# Patient Record
Sex: Female | Born: 1935 | Race: White | Hispanic: No | Marital: Married
Health system: Southern US, Community
[De-identification: ages and names within clinical notes are randomized; demographics above are authoritative.]

---

## 2004-10-31 ENCOUNTER — Ambulatory Visit: Payer: Self-pay | Admitting: General Surgery

## 2005-01-01 ENCOUNTER — Other Ambulatory Visit: Payer: Self-pay

## 2005-01-02 ENCOUNTER — Observation Stay: Payer: Self-pay | Admitting: Internal Medicine

## 2005-11-22 ENCOUNTER — Ambulatory Visit: Payer: Self-pay | Admitting: Internal Medicine

## 2006-12-13 ENCOUNTER — Ambulatory Visit: Payer: Self-pay | Admitting: Internal Medicine

## 2007-03-06 ENCOUNTER — Other Ambulatory Visit: Payer: Self-pay

## 2007-03-06 ENCOUNTER — Inpatient Hospital Stay: Payer: Self-pay | Admitting: Internal Medicine

## 2008-02-25 ENCOUNTER — Ambulatory Visit: Payer: Self-pay | Admitting: Internal Medicine

## 2009-03-30 ENCOUNTER — Inpatient Hospital Stay: Payer: Self-pay | Admitting: Internal Medicine

## 2009-04-06 ENCOUNTER — Inpatient Hospital Stay: Payer: Self-pay | Admitting: Internal Medicine

## 2009-04-09 ENCOUNTER — Encounter: Payer: Self-pay | Admitting: Internal Medicine

## 2009-04-13 ENCOUNTER — Encounter: Payer: Self-pay | Admitting: Internal Medicine

## 2009-05-13 ENCOUNTER — Encounter: Payer: Self-pay | Admitting: Internal Medicine

## 2009-06-22 ENCOUNTER — Inpatient Hospital Stay: Payer: Self-pay | Admitting: Internal Medicine

## 2010-06-13 IMAGING — CR DG CHEST 1V
1 series · 1 of 1 positions shown · non-contrast
Comparison: none

REASON FOR EXAM: evaluate vp shunt (pt w/ ams)
COMMENTS:

[view not recorded]
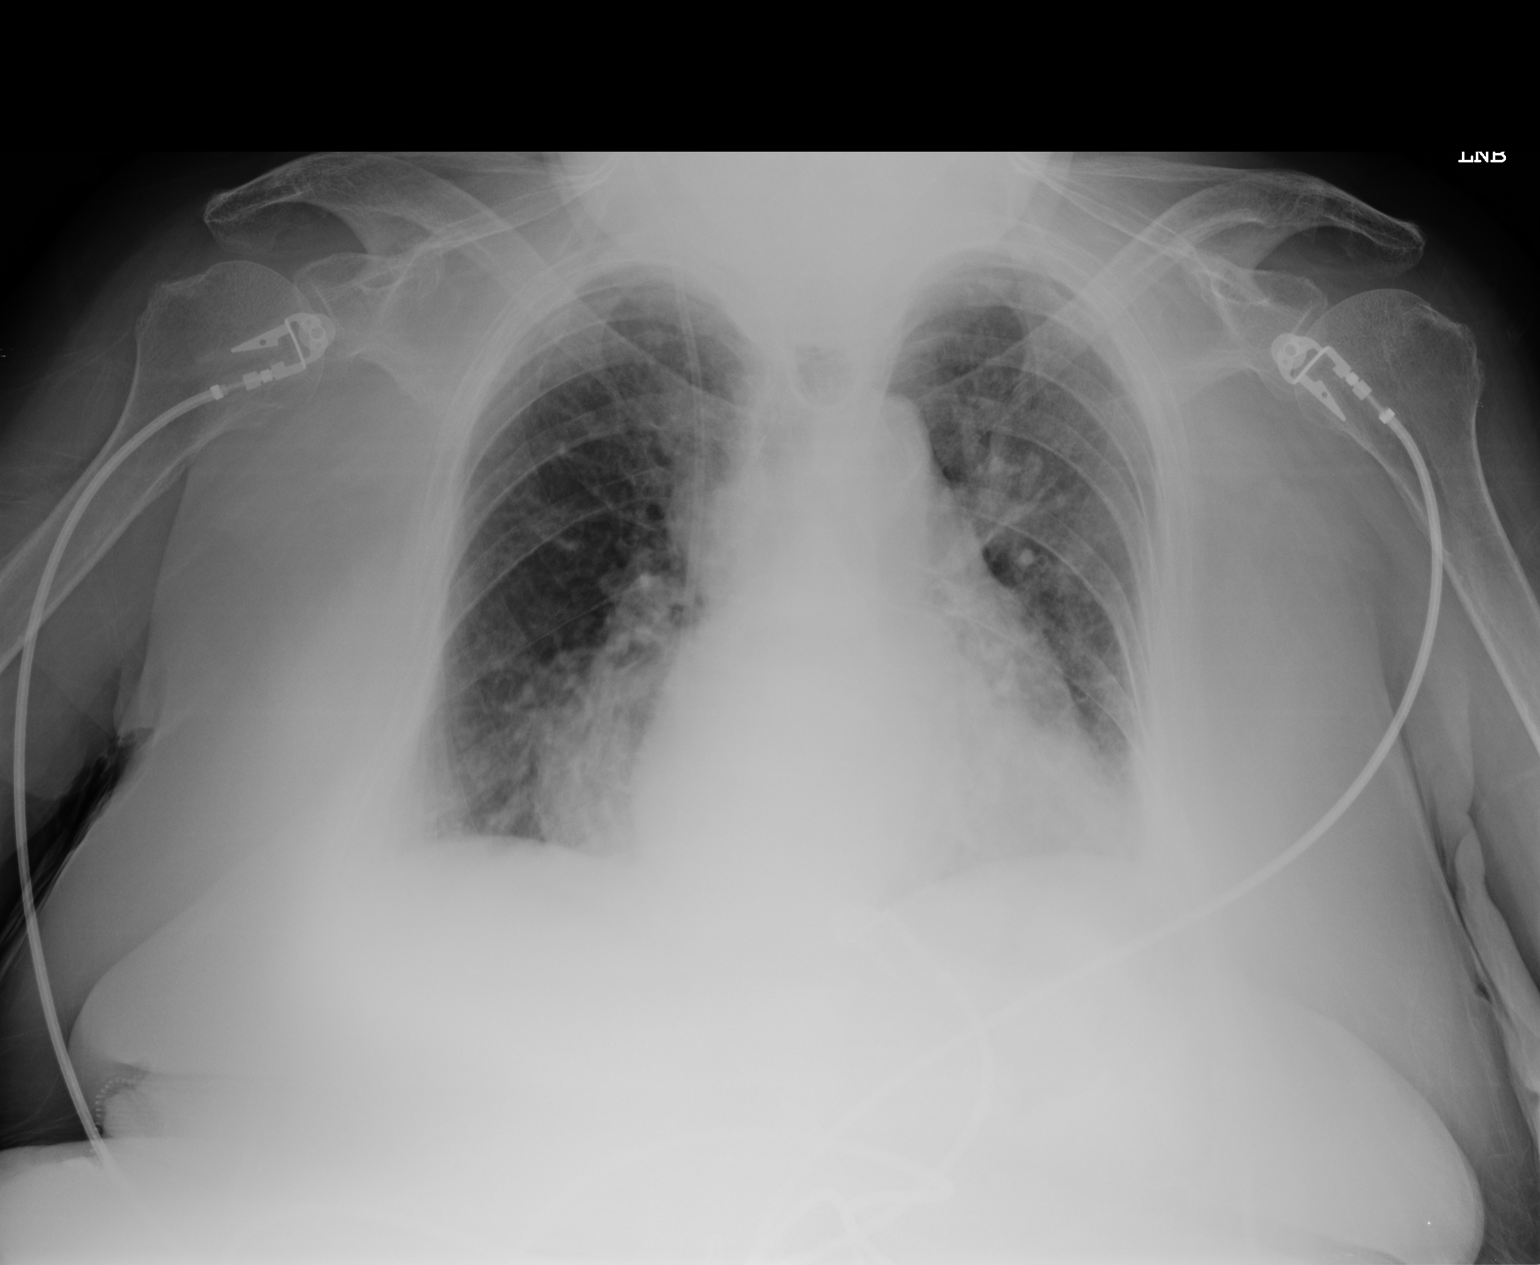

[1 of 1 positions shown; findings below may reference images not displayed]

PROCEDURE:     DXR - DXR CHEST 1 VIEWAP OR PA  - March 30, 2009  [DATE]

RESULT:      A single view of the chest shows cardiomegaly. There is
pulmonary vascular congestion. There appears to be underlying fibrosis.
Cardiac monitoring electrodes are present. A right-sided linear density
projects over the chest. It is difficult to determine if this is a shunt
tube or artifact.
IMPRESSION: 1.     Cardiomegaly.
2.     Pulmonary vascular congestion with probable underlying fibrosis.
Please note, on the previous study of 03/06/2007 the density over the right
chest was a shunt tube. This is poorly seen. Followup with PA and lateral
views would be helpful if evaluating the shunt tube is the primary concern.

## 2010-06-13 IMAGING — CT CT HEAD WITHOUT CONTRAST
2 series · 15 of 30 positions shown, 19 images · non-contrast
Comparison: none

REASON FOR EXAM: difficult to awaken, brain shunt
COMMENTS:

PROCEDURE:     CT  - CT HEAD WITHOUT CONTRAST  - March 30, 2009  [DATE]
RESULT:     Comparison: 03/06/2007
TECHNIQUE: Multiple axial images from the foramen magnum to the vertex were
obtained without IV contrast.

[Series 2: without · axial · non-contrast · 0.39mm/px · z∈[+679,+804]mm · 13 of 31 slices shown, 17 images]
[im 3/31  brain]
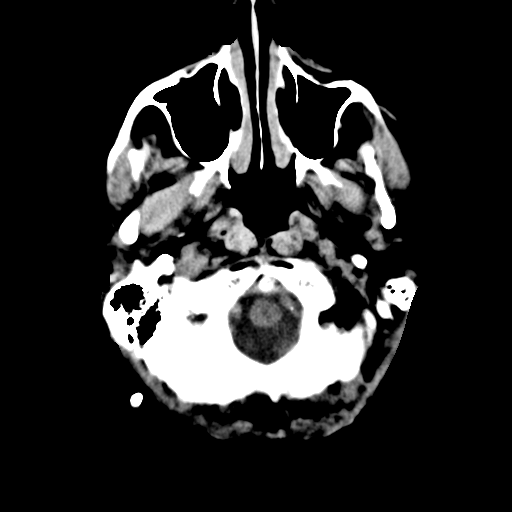
[im 3/31  bone]
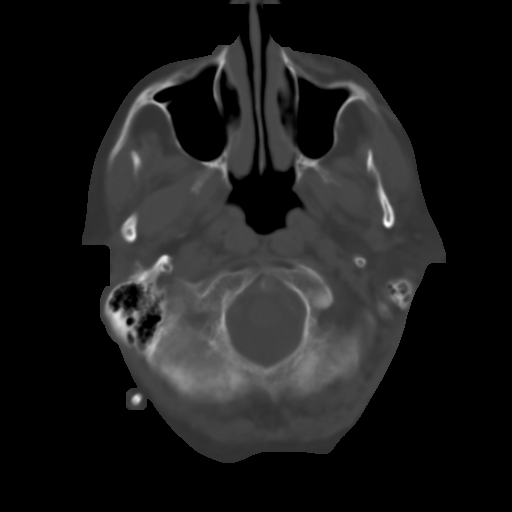
[im 5/31  brain]
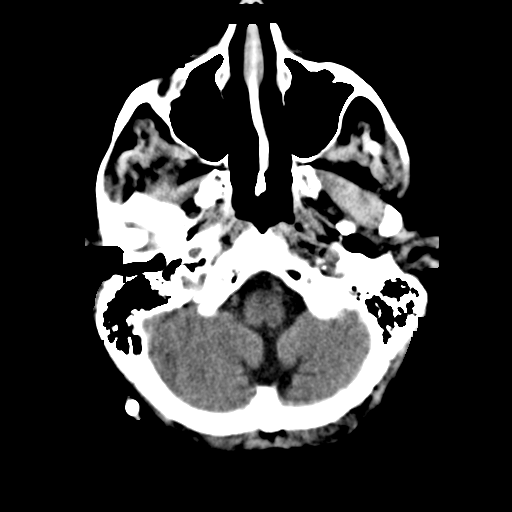
[im 7/31  brain]
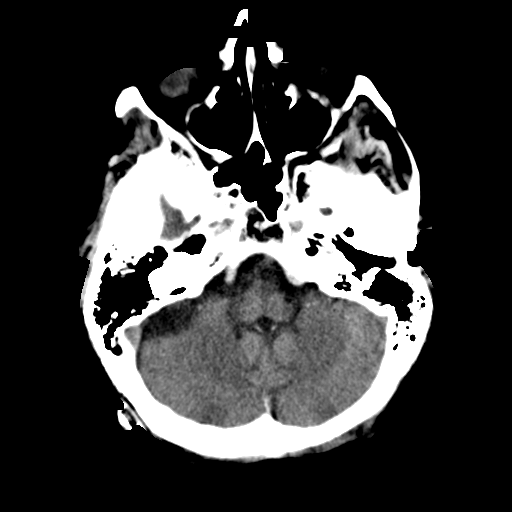
[im 9/31  brain]
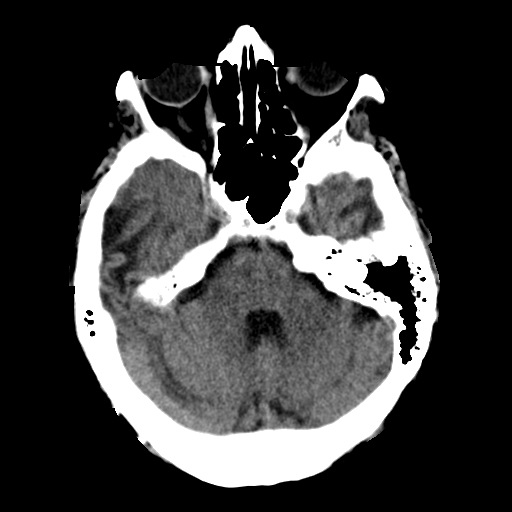
[im 11/31  brain]
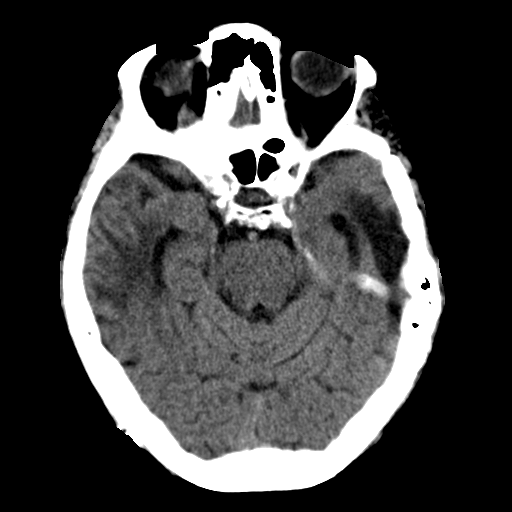
[im 11/31  bone]
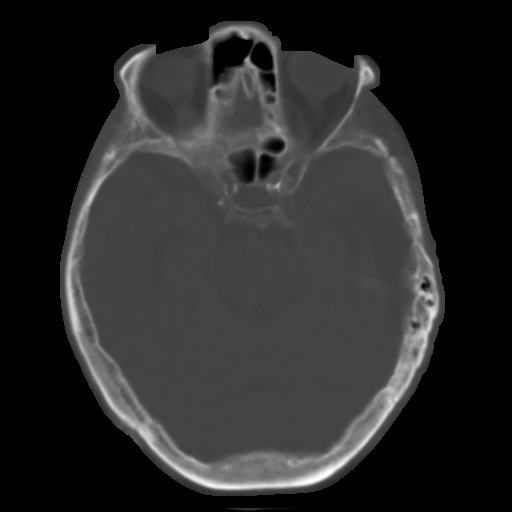
[im 13/31  brain]
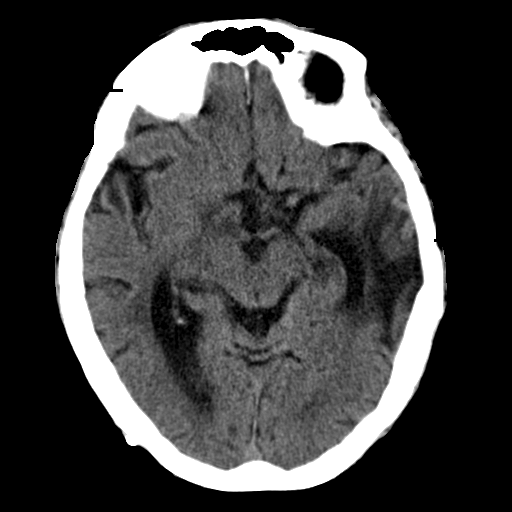
[im 16/31  brain]
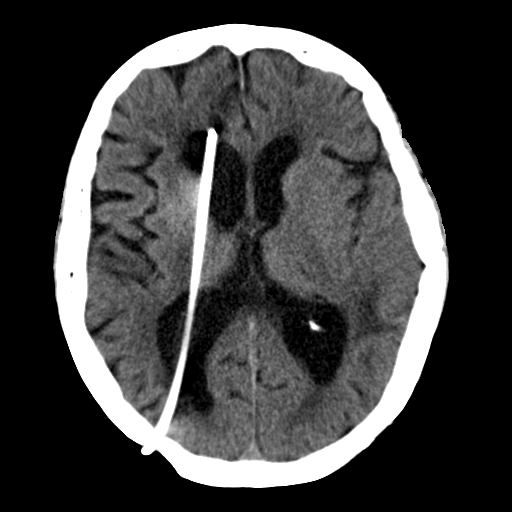
[im 18/31  brain]
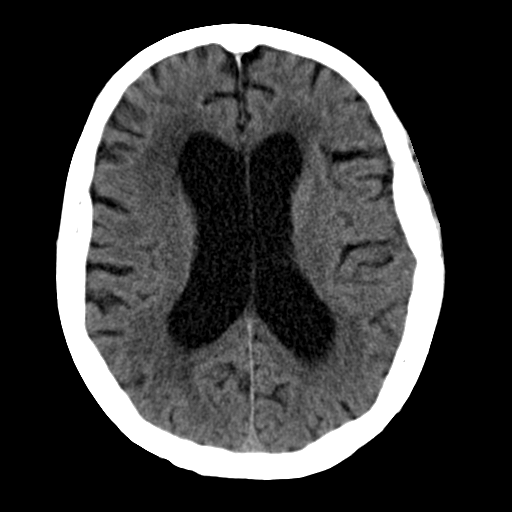
[im 20/31  brain]
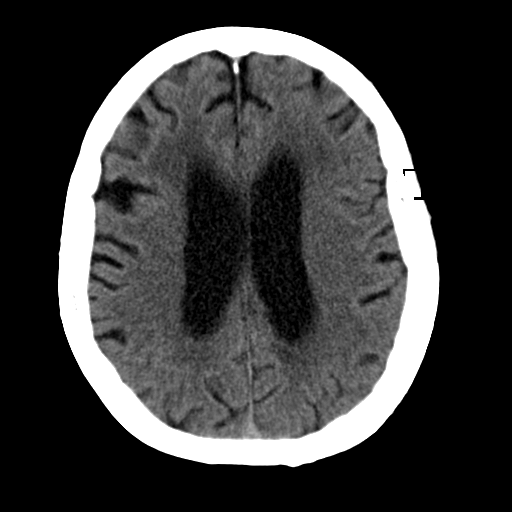
[im 20/31  bone]
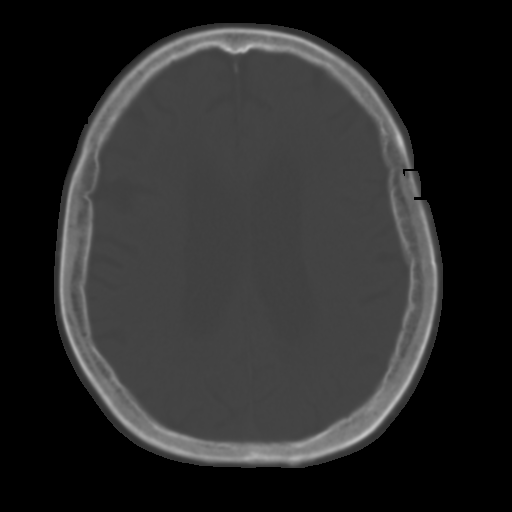
[im 22/31  brain]
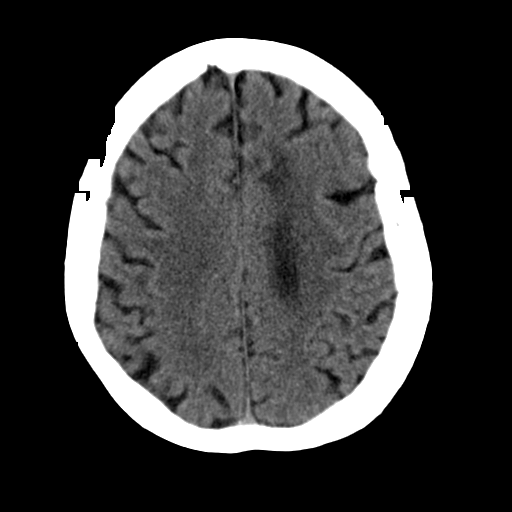
[im 24/31  brain]
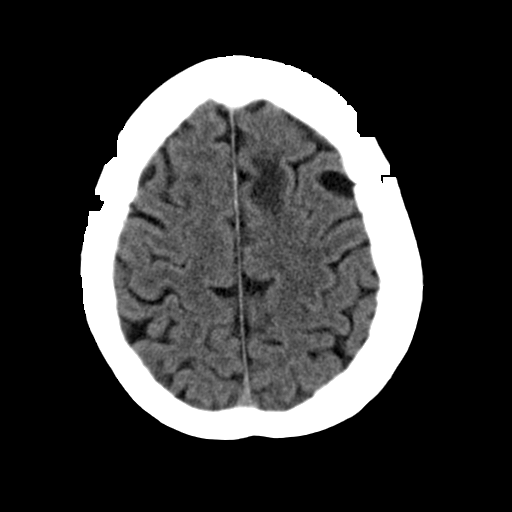
[im 26/31  brain]
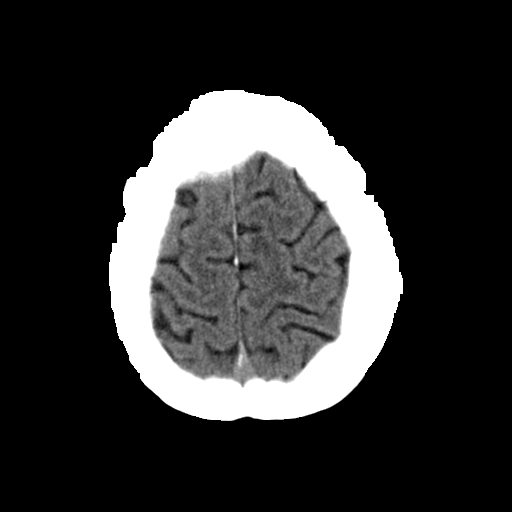
[im 28/31  brain]
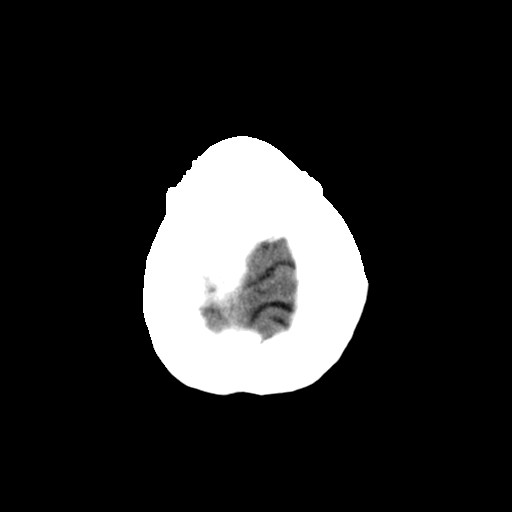
[im 28/31  bone]
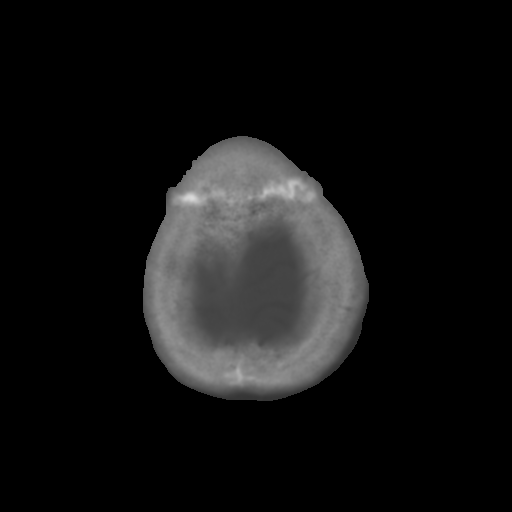

[Series 3: bone · axial · 0.39mm/px · z∈[+679,+699]mm · 2 of 31 slices shown]
[im 3/31  bone]
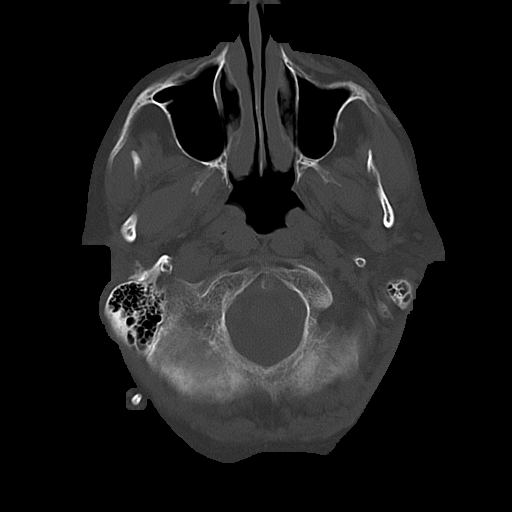
[im 7/31  bone]
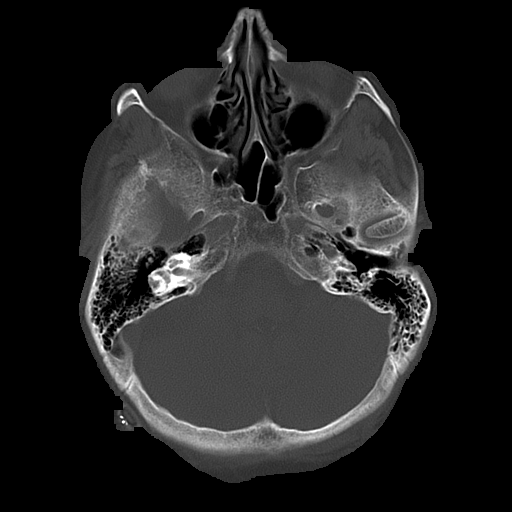

[15 of 30 positions shown; findings below may reference images not displayed]

FINDINGS: There is no evidence of mass effect, midline shift, or extra-axial fluid
collections.  There is no evidence of a space-occupying lesion or
intracranial hemorrhage. There is no evidence of a cortical-based area of
acute infarction. There is a right parietal burr hole with a right-sided
ventriculostomy catheter entering the occipital horn of the right lateral
ventricle with the tip terminating along the anterior surface of the right
frontal horn of the lateral ventricle. The overall appearance is unchanged
the prior examination. The ventricles are stable in appearance. The basal
cisterns are patent. There is bilateral inferior temporal lobe
encephalomalacia.

There is generalized cerebral atrophy. There is periventricular white matter
low attenuation likely secondary to microangiopathy.

Visualized portions of the orbits are unremarkable. The paranasal sinuses
and mastoid air cells are unremarkable.

The osseous structures are unremarkable.
IMPRESSION: No acute intracranial process.

## 2010-06-14 IMAGING — US US CAROTID DUPLEX BILAT
1 series · 17 of 24 positions shown · non-contrast
Comparison: none

REASON FOR EXAM: CVA
COMMENTS:

[Series 1: us carotid duplex bilat · 17 of 64 slices shown]
[im 1/64]
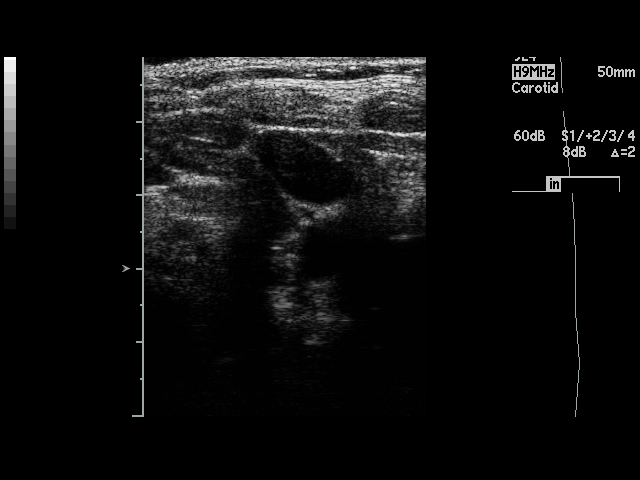
[im 6/64]
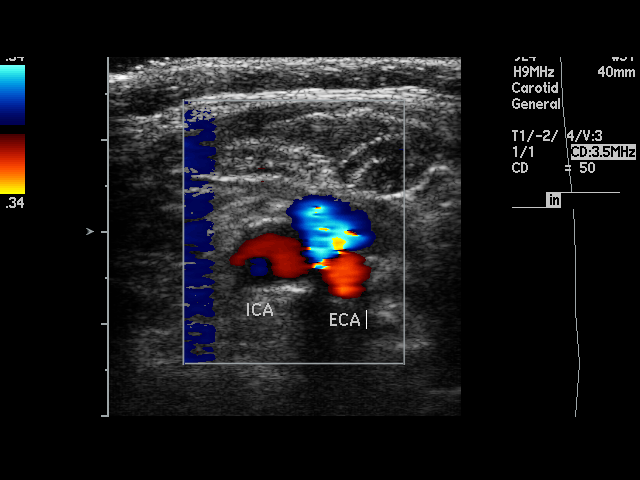
[im 9/64]
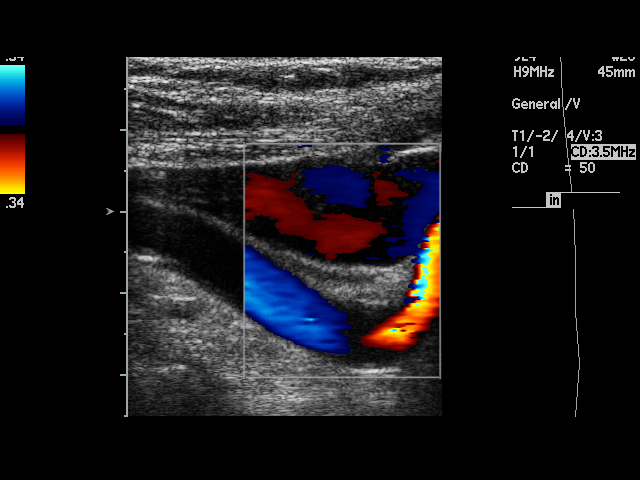
[im 11/64]
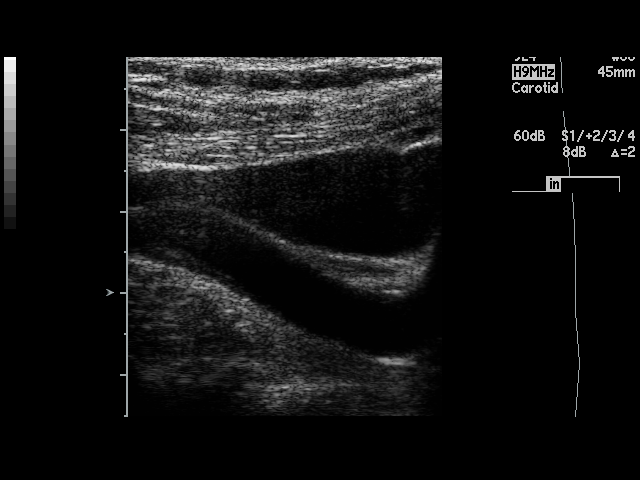
[im 17/64]
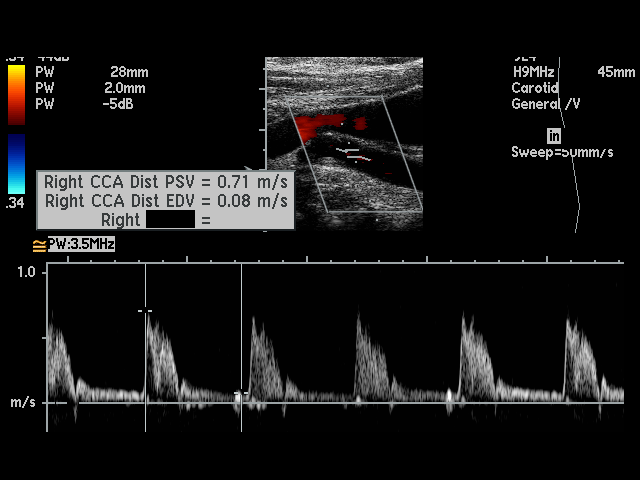
[im 20/64]
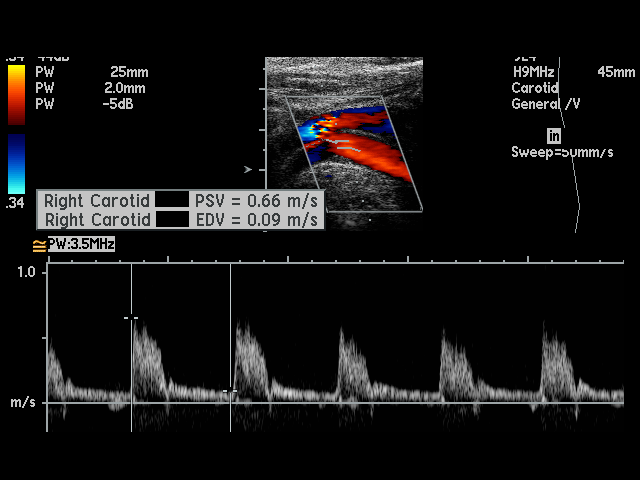
[im 25/64]
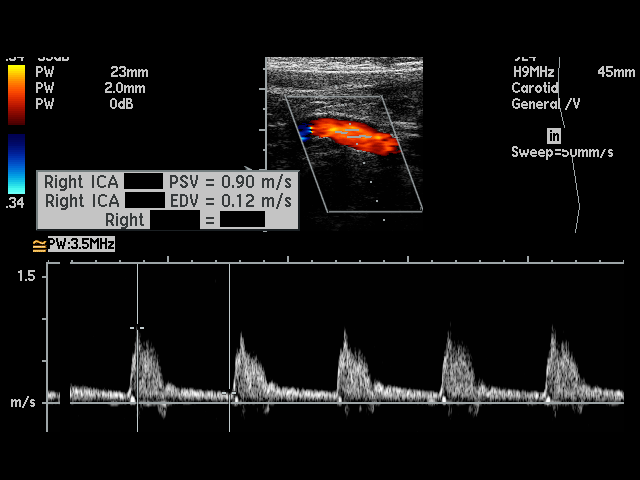
[im 28/64]
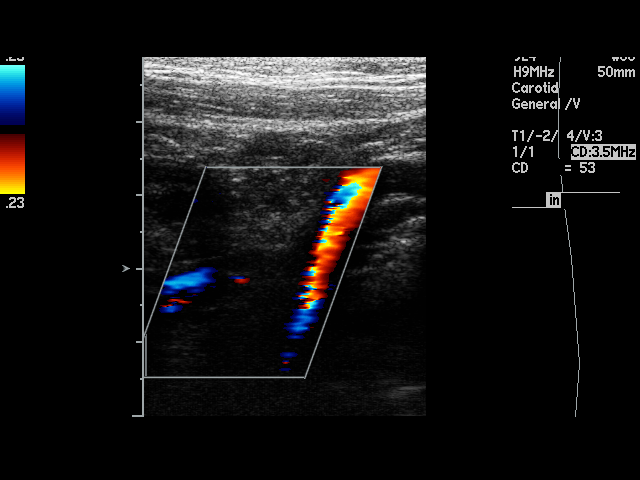
[im 33/64]
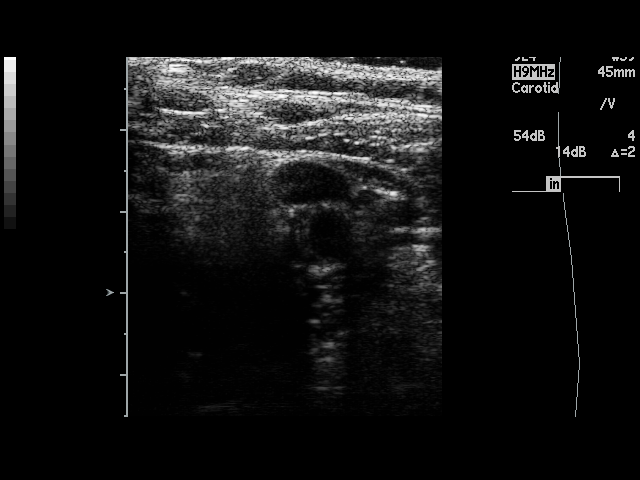
[im 36/64]
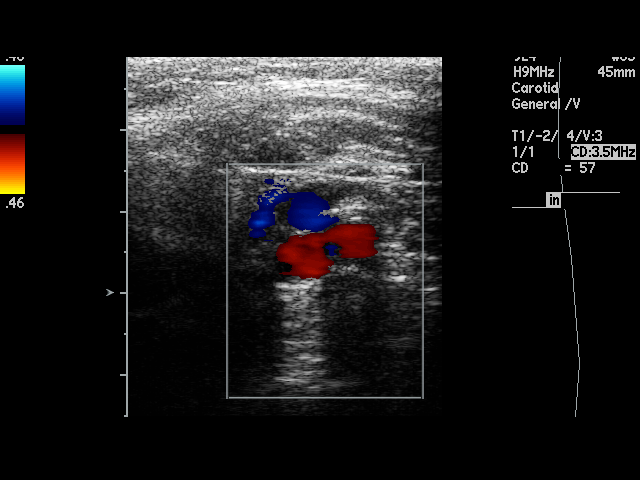
[im 39/64]
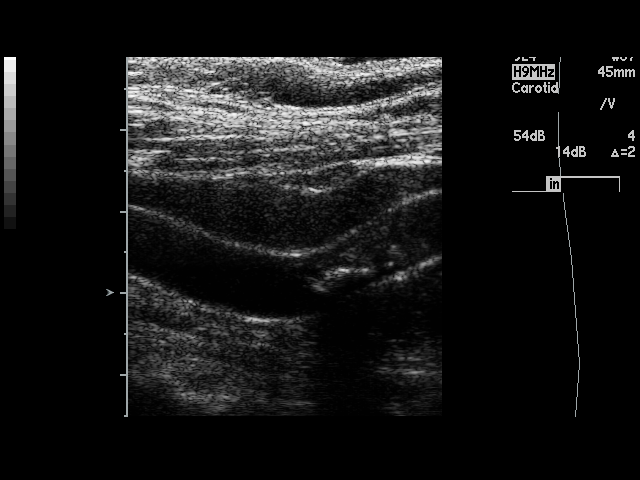
[im 44/64]
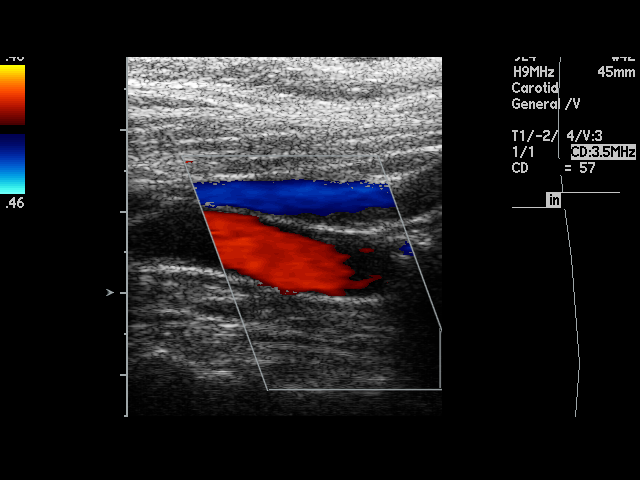
[im 47/64]
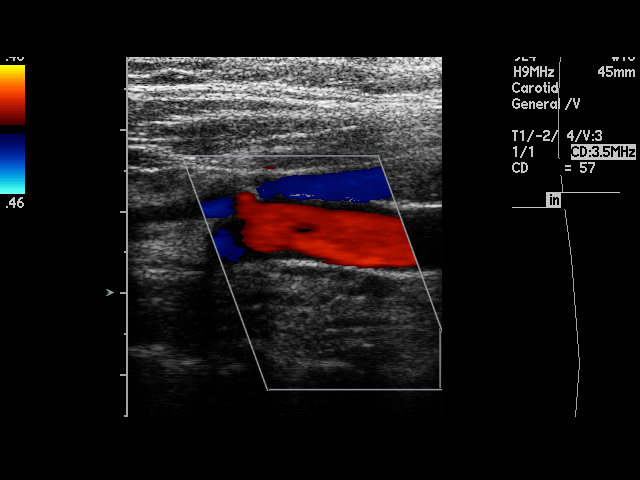
[im 53/64]
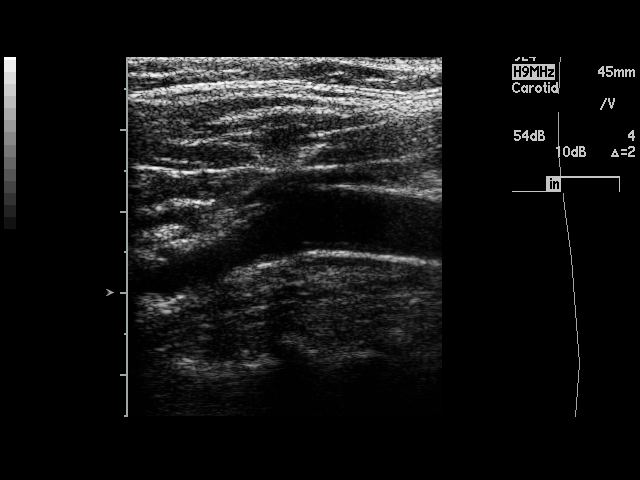
[im 55/64]
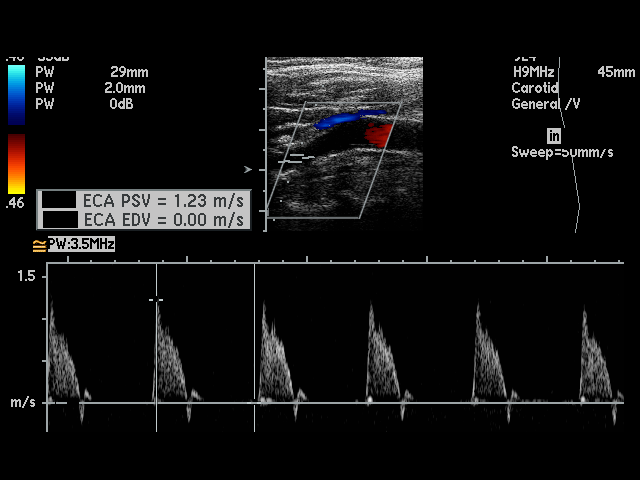
[im 58/64]
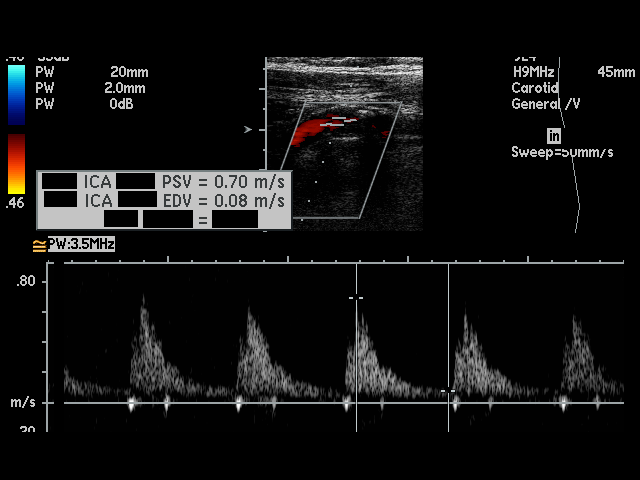
[im 64/64]
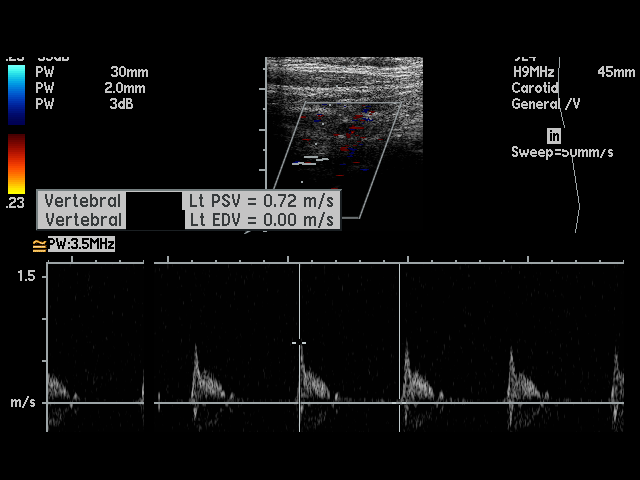

[17 of 24 positions shown; findings below may reference images not displayed]

PROCEDURE:     US  - US CAROTID DOPPLER BILATERAL  - March 31, 2009  [DATE]

RESULT:     There is a small amount of mixed smooth and calcific plaque
formation about the carotid bifurcations bilaterally. On the right, the peak
right common carotid artery flow velocity measures 0.93 meters/second and
the peak right internal carotid artery flow velocity measures
meters/second. ICA/CCA ratio is 0.97.

On the left, the peak left common carotid artery flow velocity measures
meters/second and the peak left internal carotid artery flow velocity
measures 0.7 meters/second. ICA/CCA ratio is 0.55. These values bilaterally
are consistent with the absence of hemodynamically significant stenosis.

There is observed antegrade flow in both vertebrals.
IMPRESSION: 1. No hemodynamically significant stenosis is identified on either side.
2. There is antegrade flow in both vertebrals.

## 2010-10-26 ENCOUNTER — Emergency Department: Payer: Self-pay | Admitting: Emergency Medicine

## 2012-11-13 ENCOUNTER — Ambulatory Visit: Payer: Self-pay | Admitting: Internal Medicine

## 2012-11-24 ENCOUNTER — Inpatient Hospital Stay: Payer: Self-pay | Admitting: Internal Medicine

## 2012-11-24 LAB — CBC
HCT: 38.9 % (ref 35.0–47.0)
HGB: 12.4 g/dL (ref 12.0–16.0)
MCHC: 32 g/dL (ref 32.0–36.0)
MCV: 97 fL (ref 80–100)
RBC: 3.99 10*6/uL (ref 3.80–5.20)
WBC: 8.7 10*3/uL (ref 3.6–11.0)

## 2012-11-24 LAB — COMPREHENSIVE METABOLIC PANEL
Albumin: 2.9 g/dL — ABNORMAL LOW (ref 3.4–5.0)
Anion Gap: 6 — ABNORMAL LOW (ref 7–16)
Co2: 28 mmol/L (ref 21–32)
Creatinine: 2.05 mg/dL — ABNORMAL HIGH (ref 0.60–1.30)
EGFR (African American): 25 — ABNORMAL LOW
EGFR (Non-African Amer.): 21 — ABNORMAL LOW
Osmolality: 284 (ref 275–301)
Potassium: 3.4 mmol/L — ABNORMAL LOW (ref 3.5–5.1)
SGPT (ALT): 16 U/L (ref 12–78)

## 2012-11-24 LAB — PROTIME-INR
INR: 2.4
Prothrombin Time: 26.2 secs — ABNORMAL HIGH (ref 11.5–14.7)

## 2012-11-24 LAB — TROPONIN I: Troponin-I: 0.04 ng/mL

## 2012-11-24 LAB — CK TOTAL AND CKMB (NOT AT ARMC): CK, Total: 56 U/L (ref 21–215)

## 2012-11-24 LAB — MAGNESIUM: Magnesium: 2 mg/dL

## 2012-11-24 LAB — PRO B NATRIURETIC PEPTIDE: B-Type Natriuretic Peptide: 22725 pg/mL — ABNORMAL HIGH (ref 0–450)

## 2012-11-25 LAB — COMPREHENSIVE METABOLIC PANEL
Albumin: 2.5 g/dL — ABNORMAL LOW (ref 3.4–5.0)
Alkaline Phosphatase: 99 U/L (ref 50–136)
BUN: 29 mg/dL — ABNORMAL HIGH (ref 7–18)
Bilirubin,Total: 0.7 mg/dL (ref 0.2–1.0)
Calcium, Total: 8 mg/dL — ABNORMAL LOW (ref 8.5–10.1)
Chloride: 107 mmol/L (ref 98–107)
Co2: 28 mmol/L (ref 21–32)
EGFR (Non-African Amer.): 18 — ABNORMAL LOW
Glucose: 95 mg/dL (ref 65–99)
Potassium: 3.3 mmol/L — ABNORMAL LOW (ref 3.5–5.1)
SGOT(AST): 20 U/L (ref 15–37)
SGPT (ALT): 13 U/L (ref 12–78)

## 2012-11-25 LAB — CBC WITH DIFFERENTIAL/PLATELET
Basophil %: 0.2 %
Eosinophil #: 0 10*3/uL (ref 0.0–0.7)
Eosinophil %: 0.1 %
HCT: 34.8 % — ABNORMAL LOW (ref 35.0–47.0)
Lymphocyte #: 1.9 10*3/uL (ref 1.0–3.6)
Lymphocyte %: 9.9 %
MCHC: 32.2 g/dL (ref 32.0–36.0)
MCV: 98 fL (ref 80–100)
Monocyte #: 1.5 x10 3/mm — ABNORMAL HIGH (ref 0.2–0.9)
Neutrophil %: 82 %
Platelet: 130 10*3/uL — ABNORMAL LOW (ref 150–440)
RBC: 3.56 10*6/uL — ABNORMAL LOW (ref 3.80–5.20)
RDW: 15.3 % — ABNORMAL HIGH (ref 11.5–14.5)

## 2012-11-25 LAB — CK TOTAL AND CKMB (NOT AT ARMC): CK, Total: 102 U/L (ref 21–215)

## 2012-11-25 LAB — TROPONIN I: Troponin-I: 0.02 ng/mL

## 2012-11-30 LAB — CULTURE, BLOOD (SINGLE)

## 2012-12-14 ENCOUNTER — Ambulatory Visit: Payer: Self-pay | Admitting: Internal Medicine

## 2012-12-14 DEATH — deceased

## 2014-02-07 IMAGING — CR DG CHEST 2V
1 series · 2 of 2 positions shown · non-contrast
Comparison: none

REASON FOR EXAM: cough, hypoxia
COMMENTS:

[Series 1: x chest ap · 0.14mm/px · 2 of 2 slices shown]
[im 1/2]
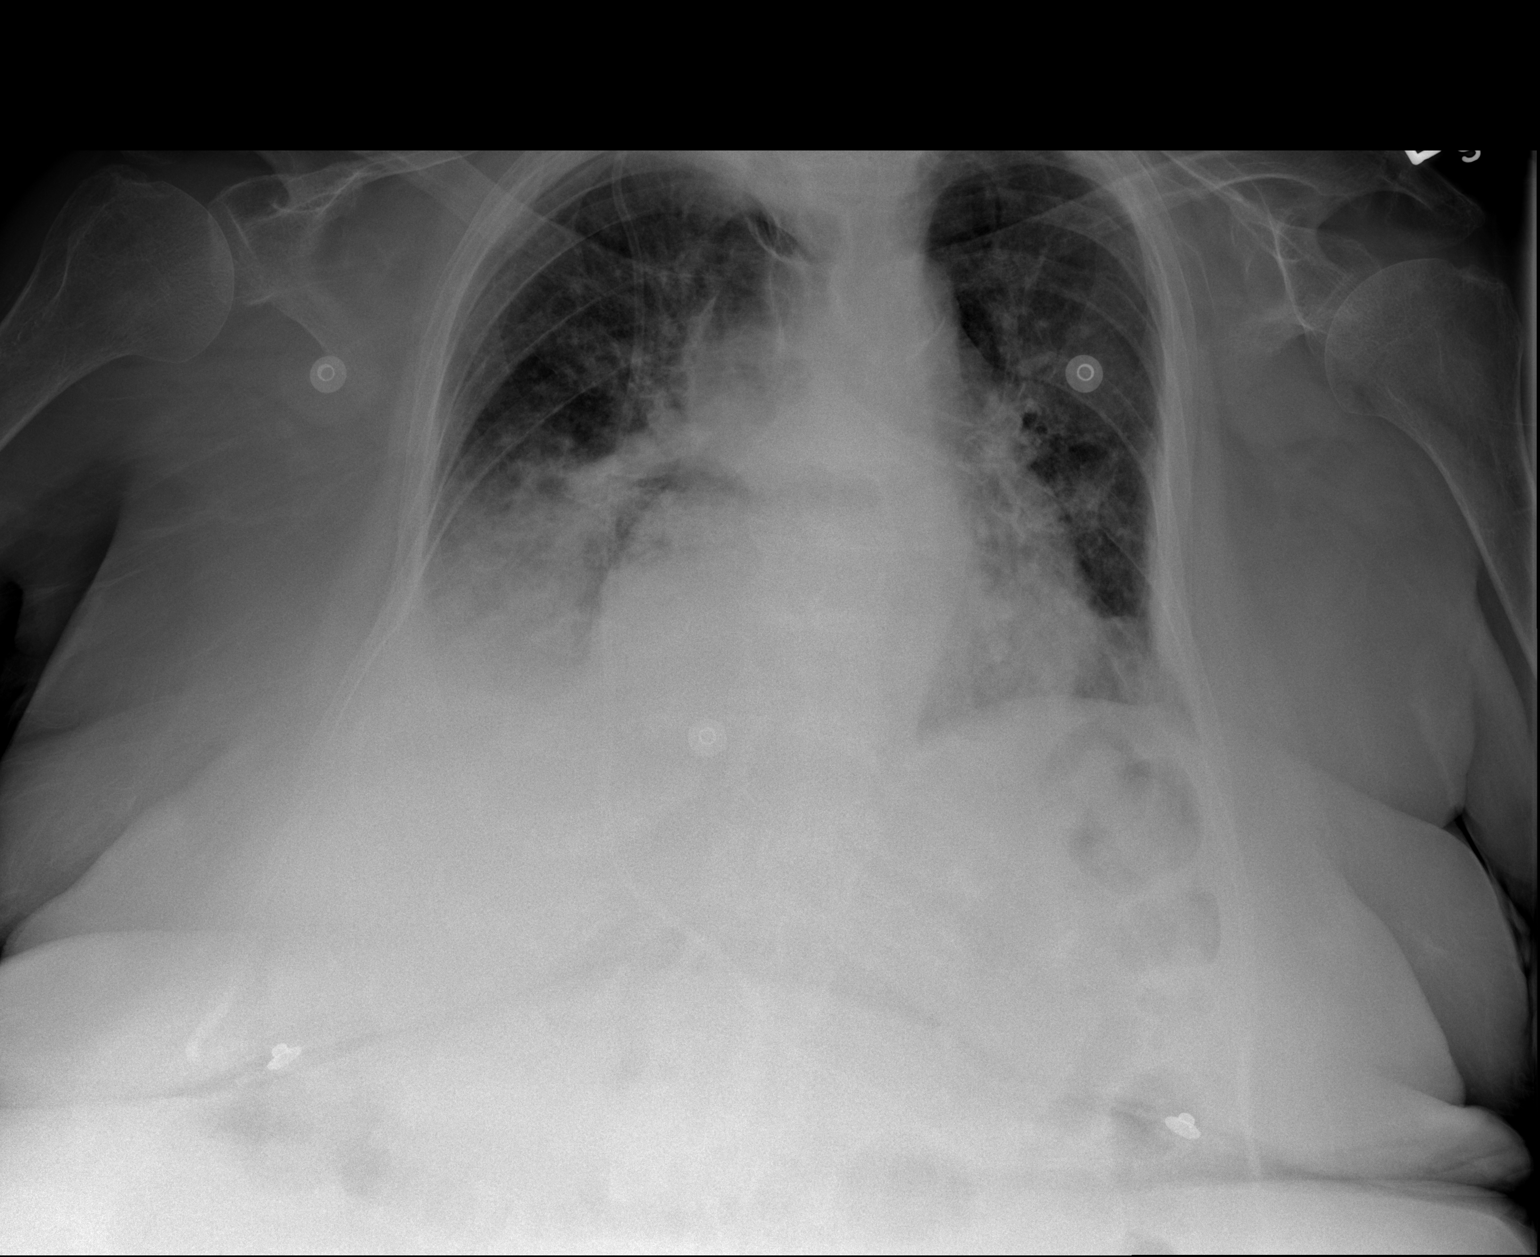
[im 2/2]
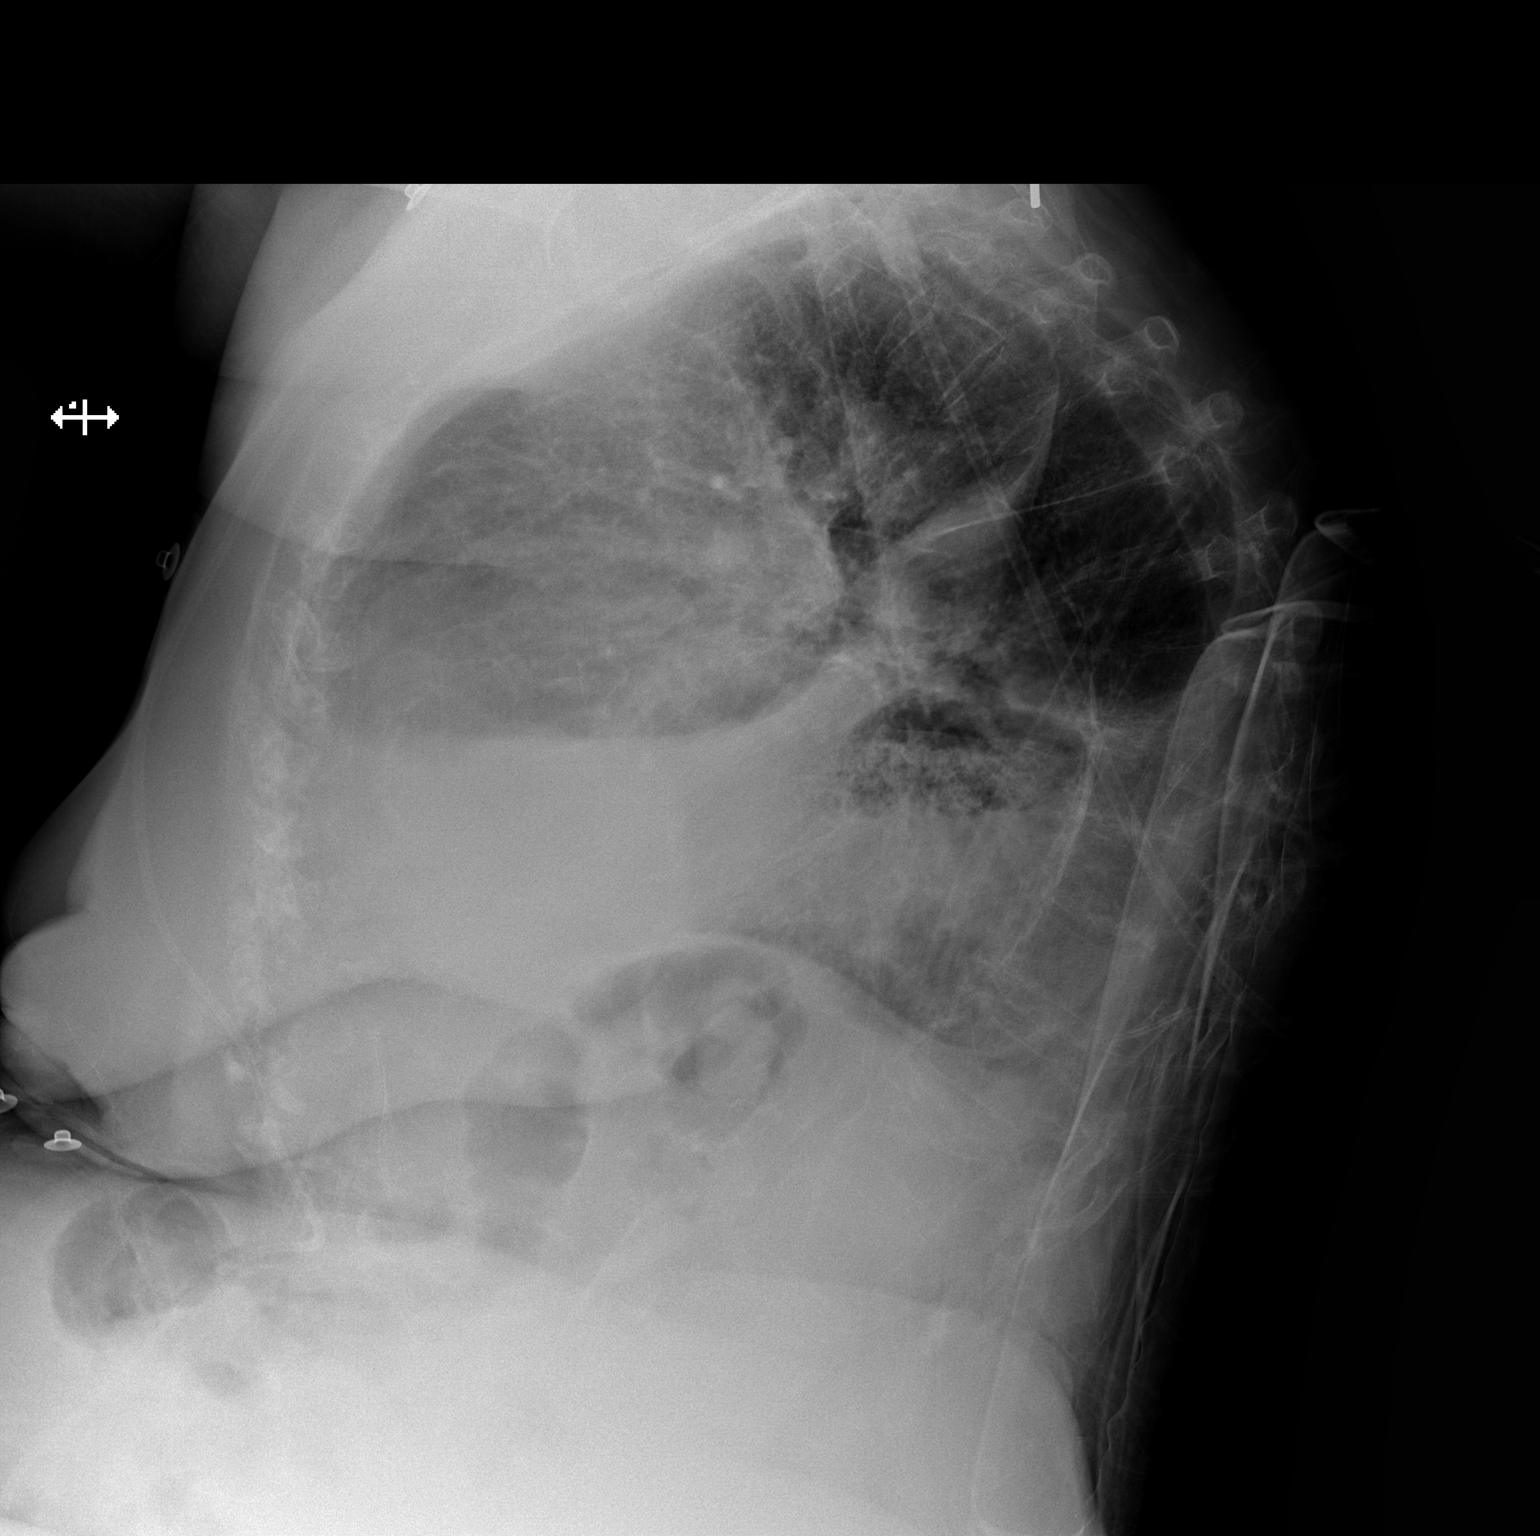

[2 of 2 positions shown; findings below may reference images not displayed]

PROCEDURE:     DXR - DXR CHEST PA (OR AP) AND LATERAL  - November 24, 2012 [DATE]

RESULT:     Comparison is made to the study April 05, 2009.

The lungs are reasonably well inflated given the degree of kyphosis. There
is a large amount of new increased density at the right lung base. On the
lateral film the appearance of this suggests a pleural effusion as well as
infiltrate. There is a small amount of pleural fluid layering posteriorly on
the left. The cardiac silhouette is enlarged. A large a hiatal hernia is
suspected as well. The pulmonary vascularity is indistinct.
IMPRESSION: New increased density at the right lung base is consistent with pneumonia
with a small infiltrate versus atelectasis at the left lung base. There is a
moderate sized right pleural effusion.
2. There is a large hiatal hernia/ partially intrathoracic stomach.

[REDACTED]

## 2015-03-05 NOTE — Discharge Summary (Signed)
PATIENT NAME:  Maureen Mcfarland, Maureen Mcfarland MR#:  [id number] DATE OF BIRTH:  10-02-1923  DATE OF ADMISSION:  11/24/2012 DATE OF DISCHARGE:  11/25/2012  DISCHARGE DIAGNOSES: 1. Right lower lobe pneumonia complicated by a right pleural effusion and respiratory failure.  2. Chronic atrial fibrillation.  3. Atherosclerotic cardiovascular disease, transient ischemic attack.  4. Congestive heart failure.   DISCHARGE MEDICATIONS: Senna 2 tabs every other day, Demadex 10 mg daily, Topamax 25 mg daily, spironolactone 25 mg daily, Ativan 0.5 mg 1/2 tab q. 4 p.r.n. agitation, and DuoNebs SVN q.i.d. p.r.n. wheezing, rectal Tylenol 650 mg every 6 hours p.r.n., morphine 20 mg/mL, 0.5 mL q. hour p.r.n. pain/dyspnea.   REASON FOR ADMISSION: The patient is an 81 year old female who presents with respiratory failure and obtundation. Please see H and P for HPI, past medical history, and physical exam.   HOSPITAL COURSE: The patient was admitted, really fairly obtunded. She opens eyes but is not swallowing at all, has significant chronic aspiration pneumonia with associated right pleural effusion. The patient has had steady decline. The family is not interested in treatment, and she will be going back to the assisted living facility for COMFORT CARE per Hospice.   ____________________________ Danella PentonMark F. Aldous Housel, MD [id number] D: 11/25/2012 13:05:22 ET T: 11/25/2012 13:20:57 ET JOB#: [id number]  cc: Danella PentonMark F. Adin Laker, MD, <Dictator> Karolyne Timmons Sherlene ShamsF Jacorian Golaszewski MD ELECTRONICALLY SIGNED July 26, 2013 8:24

## 2015-03-05 NOTE — H&P (Signed)
PATIENT NAME:  Maureen Mcfarland, Maureen Mcfarland MR#:  [id number] DATE OF BIRTH:  [birth year]  DATE OF ADMISSION:  11/24/2012  PRIMARY CARE PHYSICIAN:  Bethann Punches, MD   HISTORY OF PRESENT ILLNESS:  The patient is an 81 year old Caucasian female with past medical history significant for history of cerebral trauma, history of hydrocephalus requiring ventriculoperitoneal shunt in the past, who is followed by Hospice, presented to the hospital with complaints of shortness of breath. Apparently, the patient was not doing well and has been coughing for the past 3 days now. She has been also more somnolent and very weak, not able to get up from the bed and even communicate with her husband. Today, however, she was noted to have a productive cough with white and thick phlegm. She was also noted to have significant shortness of breath. The patient was seen by the patient's hospice nurse, who requested the patient to be sent to the Emergency Room for further evaluation. Apparently, the patient was complaining of pain all over her body, especially in her lower extremities. She was also noted to have lower extremity swelling. Here in the Emergency Room, she had chest x-ray done which showed right-sided pneumonia, as well as right-sided pleural effusion and questionable congestive heart failure-related, and Hospitalist services were contacted for admission. She was also noted to be hypoxic with oxygen saturations of 85% on 2 liters of oxygen through nasal cannula. Apparently, she is on oxygen at home, but the patient's husband is not sure how much she uses, 2 or 3 liters of oxygen. Now she was on 4 liters of oxygen through a Ventimask. ABGs were done and showed acidosis, which was respiratory and questionable lactic as well. Now she is on 10 liters of oxygen. Per Nursing Home, however, she was noted to be slumped over at breakfast with oxygen saturations in the 70s, and that is the reason she was sent to the hospital. According to the  patient's husband, she has been nauseated, but it is very unclear if she was vomiting at all. Her oral intake is very poor. She is not eating or drinking fluids, however, not losing weight for the past month now. She lives in [facility] in an assisted living facility with her husband at this time.   PAST MEDICAL HISTORY:  Significant for history of acute renal failure on May 2010 admission secondary to hypovolemia. At that time, she had nausea and vomiting due to Tegretol toxicity. She also has history of osteoarthritis, chronic atrial fibrillation on Coumadin therapy, hyperlipidemia, history of fever of unknown origin, drug rash due to Levaquin or Rocephin, history of cerebral trauma with hydrocephalus and ventriculoperitoneal shunt in 1995, left leg melanoma, history of DVT, hyperlipidemia, history of TIAs, diverticulosis, gastroesophageal reflux disease, seizure disorder and also dementia.  PAST SURGICAL HISTORY:  Tonsillectomy, as well as adenoidectomy and VP shunt in 1995.   MEDICATIONS: 1.  Acetaminophen 650 mg rectal suppository, 1 every 6 hours as needed. 2.  Aspercreme 10% topical cream to both knees 4 times daily as needed. 3.  Ativan 0.5 mg, 1/2 tablet, which will be 0.25 mg every 4 hours as needed.  4.  Atropine 2 drops orally every 1 hour as needed.  5.  Coumadin 1 mg on Tuesdays, Thursdays, Saturdays as well as Sundays, and 2 mg on Mondays, Wednesdays, as well as Fridays.  6.  Demadex 10 mg p.o. daily.  7.  Detrol LA 4 mg p.o. daily.  8.  DuoNebs 0.5/2.5 mg in 3 mL  inhalation solution, 1 vial 4 times daily as needed.  9.  Exelon patch 9.5 mg once daily.  10.  Milk of magnesia 30 mL once daily as needed.  11.  MiraLax 17 grams every other day.  12.  Morphine oral concentrate, which is Roxanol,10 mg in 1 mL oral solution, 0.5 mL every 2 hours as needed.  13.  MS Contin 15 mg p.o. every 8 hours.  14.  Namenda 10 mg p.o. twice daily.  15.  Omeprazole 10 mg p.o. twice daily.  16.   Robitussin 10 mL every 6 hours as needed.  17.  Senna S 50/8.6 mg, 2 tablets every other day alternating with MiraLax.  18.  Spironolactone 0.25 mg p.o. daily.  19.  Tessalon Perles 200 mg p.o. 3 times daily.  20.  Topamax 25 mg p.o. at bedtime.  21.  Tylenol caplets, 500 mg 3 times daily.  22.  Vitamin B12 1000 mcg orally once daily.  23.  Vitamin D3 2000 units once daily.  24.  Zinc oxide 20% topical ointment to buttock area twice a day.  25.  Zocor 20 mg p.o. at bedtime.   SOCIAL HISTORY:  The patient lives now in [facility] assisted living facility with her husband. No history of alcohol or tobacco abuse.   FAMILY HISTORY:  Hypertension as well as stroke.   REVIEW OF SYSTEMS:  Not obtainable as the patient is not responding to verbal stimuli.   PHYSICAL EXAMINATION: VITAL SIGNS:  On arrival to the hospital, temperature was 98.8, pulse 120, respiratory rate was 72, blood pressure 140/79, saturation was 93% on oxygen therapy. She was noted to have oxygen saturations of 90 to 92% on 3 liters of oxygen; however, as time progressed her oxygenation decreased to 85%. She was placed on 4 liters of oxygen at that time. ABGs were done on 4 liters of oxygen, and her saturations were noted to be 80.2%.  GENERAL:  This is a well-developed, well-nourished Caucasian female, very somnolent and not arousable except to painful stimuli, lying on the stretcher. She has gray discoloration of her face, and she is not opening her eyes and not communicating verbally.  HEENT:  Her pupils are equal and reactive to light. Extraocular movements are intact. No icterus or conjunctivitis.  Not able to assess her hearing.  No pharyngeal erythema. Mucosa is moist. She has Ventimask placed on her face. NECK:  Did not reveal any masses. Supple, nontender. Thyroid is not enlarged. No adenopathy. No JVD or carotid bruits bilaterally.  Full range of motion.  LUNGS:  Markedly diminished breath sounds. Some air entrance is  noted on the right, however, very diminished breath sounds as well as some rhonchi and wheezing as well as crackles were noted on the left. The patient has intermittent wheezing, as well as labored inspirations, as well as dullness to percussion on the left side left lower area. She is in respiratory distress, however, her respiration rate is 13. She has a deep breathing pattern. CARDIOVASCULAR:  S1, S2 appreciated. No murmurs, gallops, or rubs noted. Breath sounds are  distant. Rhythm was regular. I am not able to hear murmurs. Chest is tender to palpation. Pedal pulses 1+, 2 to 3+ lower extremity edema.  No calf tenderness or cyanosis was noted.  ABDOMEN:  Soft, nontender, bowel sounds are present. No splenomegaly or masses were noted.  MUSCULOSKELETAL:  Muscle strength, not able to assess as the patient is not cooperative.  No cyanosis, degenerative joint disease,  calf tenderness .  Gait is not tested.  SKIN:  Did not reveal any rashes, lesions, erythema, nodularity or induration.  It was warm and dry to palpation. The patient had grayish discoloration of her face, though.  LYMPH:  No adenopathy in the cervical region.  NEUROLOGICAL:  I am not able to assess her sensory or cranial nerves.  She is able to respond only to painful stimuli, sternal rub.  She is not moving her upper or lower extremities, though, and she is nonverbal.  PSYCHIATRIC:  Not able to assess her orientation as well.   LABORATORY, DIAGNOSTIC AND RADIOLOGICAL DATA:  BMP showed glucose of 131. B-type natriuretic peptide was 22,725. BUN and creatinine were 25 and 2.05. Potassium 3.4, otherwise unremarkable BMP.  Magnesium level is normal at 2.0. Liver enzymes: Albumin level of 2.9, otherwise unremarkable. Cardiac enzymes, first set, was negative. White blood cell count is normal at 8.7, hemoglobin was 12.4, platelet count 166. Coagulation panel: Pro time was 26.2, INR 2.4. ABGs were done on 44% FiO2 and showed pH of 7.33, pCO2 48, pO2  48, saturation was 80.2%.   EKG revealed a fib at a rate of 118 beats per minute, rapid ventricular response, low voltage QRS. Nonspecific ST-T changes were noted. Chest x-ray, PA and lateral, 11/24/2012, revealed increased density at the right lung base consistent with pneumonia as well as small infiltrate versus atelectasis at left lung base. There is moderate right-sided pleural effusion. A large hiatal hernia, partially intrathoracic stomach, was also noted.   ASSESSMENT AND PLAN: 1.  Acute on chronic respiratory failure: Admit the patient to a medical floor. I discussed with the patient's husband the patient's CODE STATUS, and she is DNR. We will start her on BiPAP. We will follow ABGs approximately an hour later after BiPAP is started. We will continue the patient on Lasix as well as Zithromax, meropenem and vancomycin for now. We will ask Palliative Care to see the patient, and if the patient does not improve she may benefit from transferring to Hospice Home.  2.  Bacterial pneumonia: We will continue broad-spectrum antibiotics. We will get sputum cultures.  3.  Atrial fibrillation and rapid ventricular response: Likely due to acidosis. The patient is on Cardizem drip IV for now.  4.  Right pleural effusion:  Lasix for now. Unable to get thoracentesis due to the patient being on Coumadin. We will hold Coumadin. We will reassess the patient's pleural effusion with Lasix administration and urine output. We will continue oxygen for now. The patient may need to have thoracentesis later on to improve her oxygenation.   TIME SPENT:  One hour.     ____________________________ Maureen Caperima Latrish Mogel, MD rv:cb D: 11/24/2012 15:17:00 ET T: 11/24/2012 16:46:26 ET JOB#: [id number]  cc: Danella PentonMark F. Miller, MD Maureen Caperima Shany Marinez, MD, <Dictator> Ajeet Casasola MD ELECTRONICALLY SIGNED 01/02/2013 14:13
